# Patient Record
Sex: Female | Born: 2000 | Race: White | Hispanic: No | Marital: Single | Smoking: Never smoker
Health system: Southern US, Community
[De-identification: ages and names within clinical notes are randomized; demographics above are authoritative.]

## PROBLEM LIST (undated history)

## (undated) DIAGNOSIS — R109 Unspecified abdominal pain: Secondary | ICD-10-CM

## (undated) DIAGNOSIS — K219 Gastro-esophageal reflux disease without esophagitis: Secondary | ICD-10-CM

## (undated) DIAGNOSIS — IMO0001 Reserved for inherently not codable concepts without codable children: Secondary | ICD-10-CM

## (undated) DIAGNOSIS — F419 Anxiety disorder, unspecified: Secondary | ICD-10-CM

## (undated) DIAGNOSIS — J45909 Unspecified asthma, uncomplicated: Secondary | ICD-10-CM

## (undated) DIAGNOSIS — K5900 Constipation, unspecified: Secondary | ICD-10-CM

## (undated) HISTORY — DX: Anxiety disorder, unspecified: F41.9

## (undated) HISTORY — PX: WISDOM TOOTH EXTRACTION: SHX21

## (undated) HISTORY — DX: Unspecified abdominal pain: R10.9

---
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
## 2018-11-03 DIAGNOSIS — J45909 Unspecified asthma, uncomplicated: Secondary | ICD-10-CM | POA: Insufficient documentation

## 2018-12-28 IMAGING — US US ABDOMEN COMPLETE
1 series · 14 of 25 positions shown · non-contrast
Comparison: July 04, 2011

CLINICAL DATA: Nausea and abdominal pain, chronic

EXAM:
ABDOMEN ULTRASOUND COMPLETE

[Series 1: us abdomen complete · 0.19mm/px · 14 of 73 slices shown]
[im 1/73]
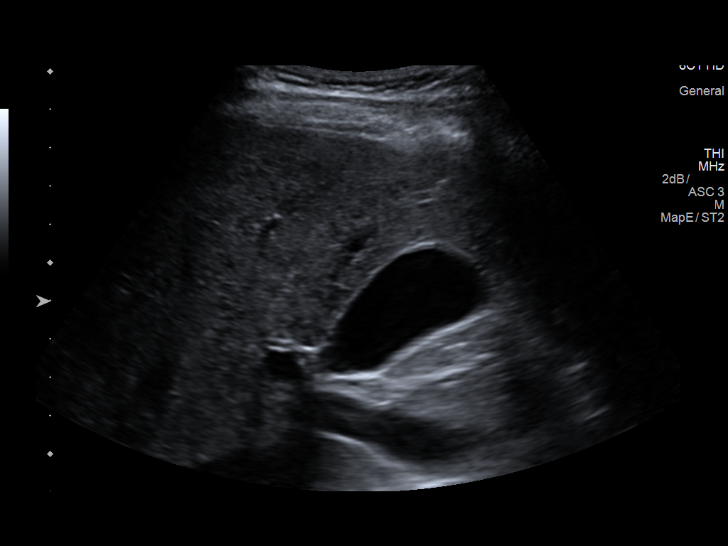
[im 7/73]
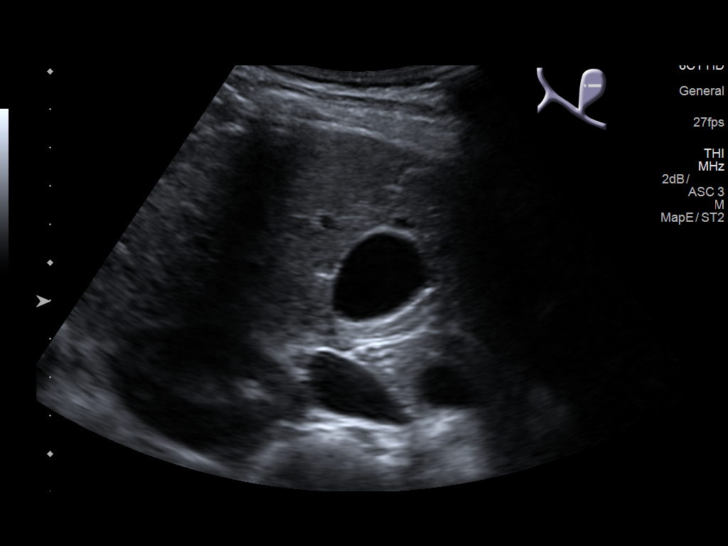
[im 13/73]
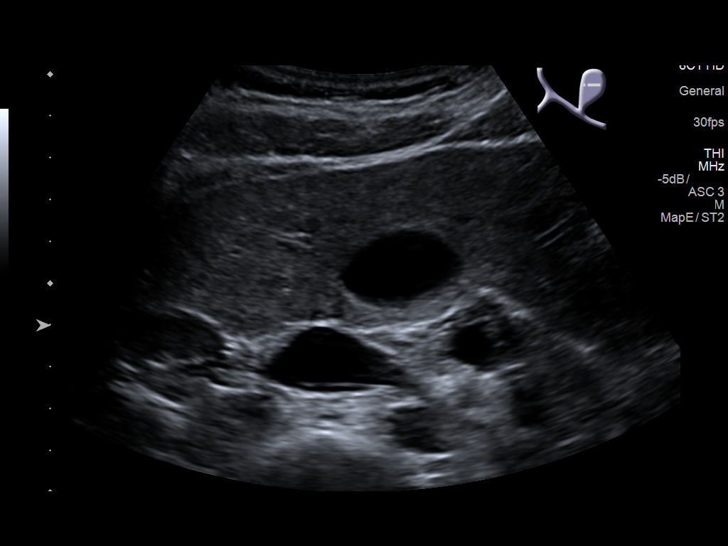
[im 19/73]
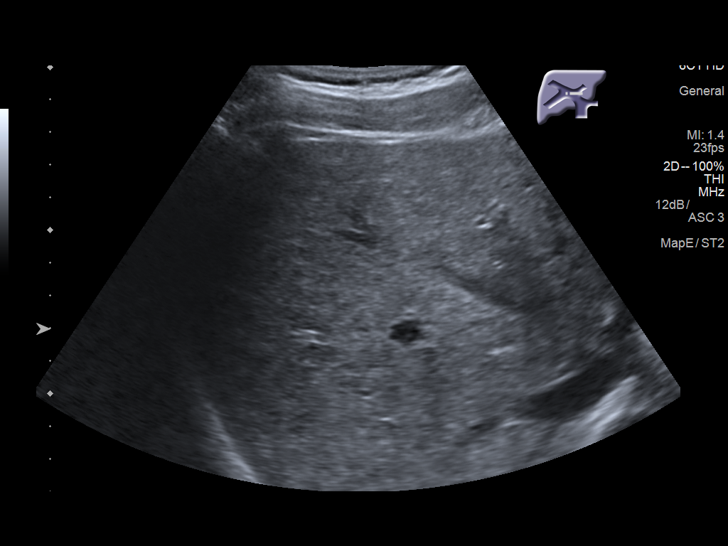
[im 25/73]
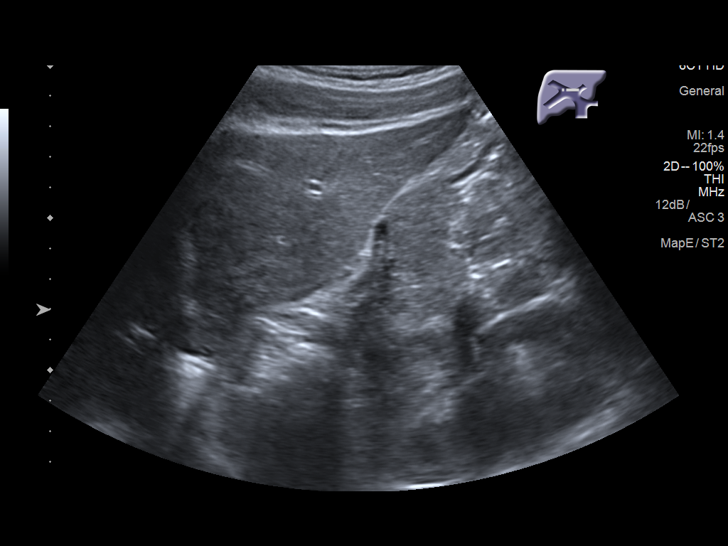
[im 28/73]
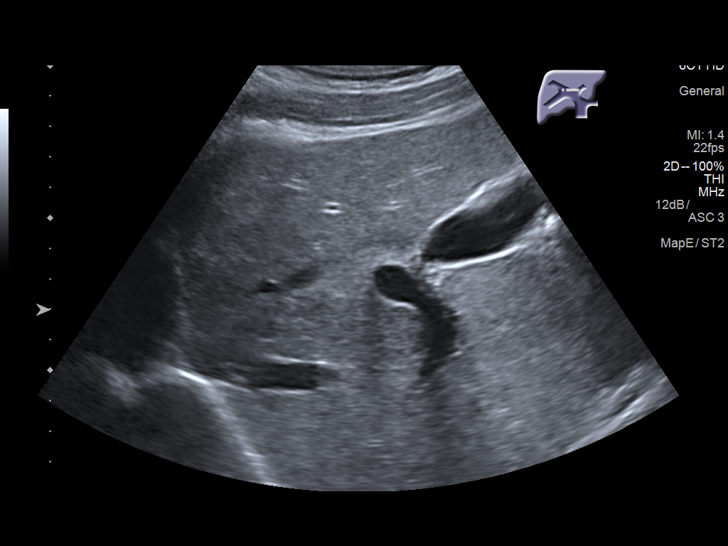
[im 34/73]
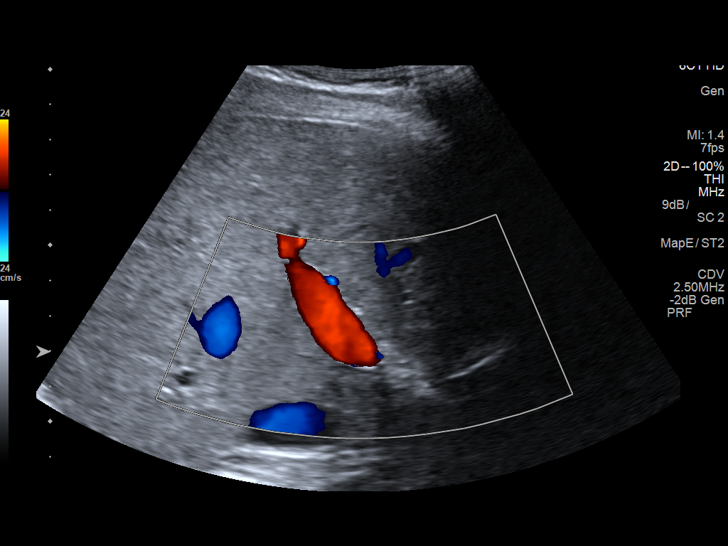
[im 40/73]
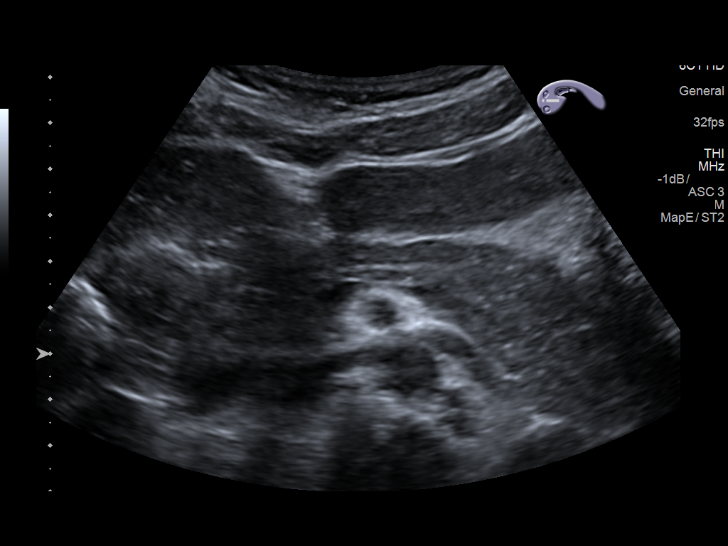
[im 46/73]
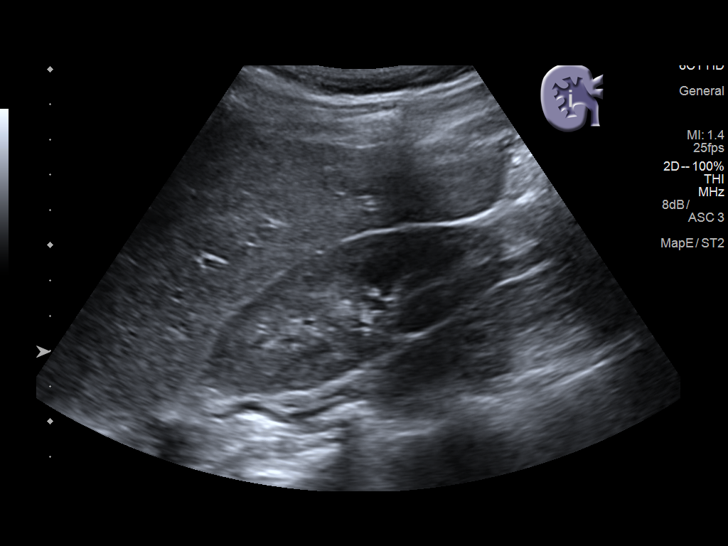
[im 49/73]
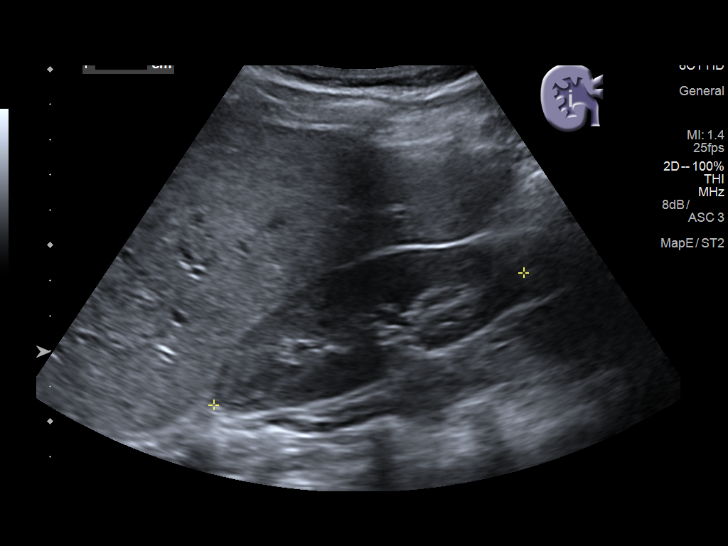
[im 55/73]
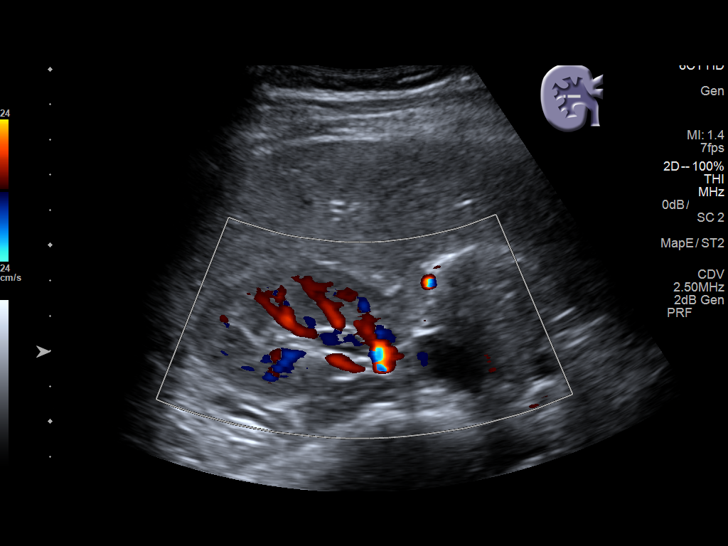
[im 61/73]
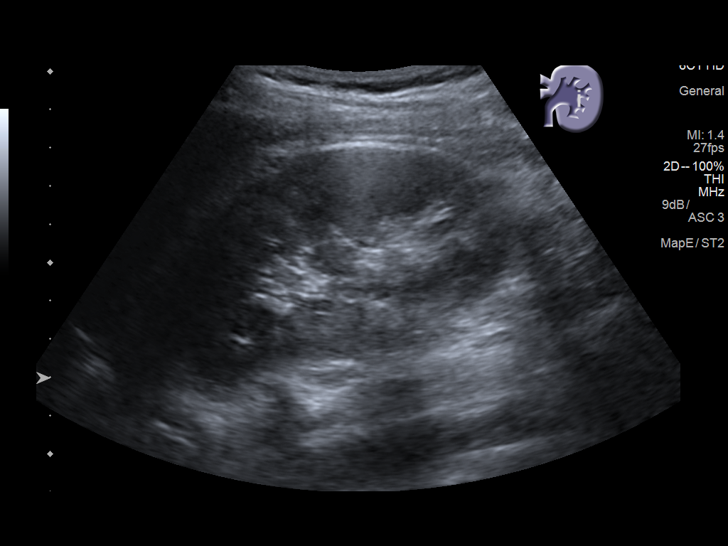
[im 67/73]
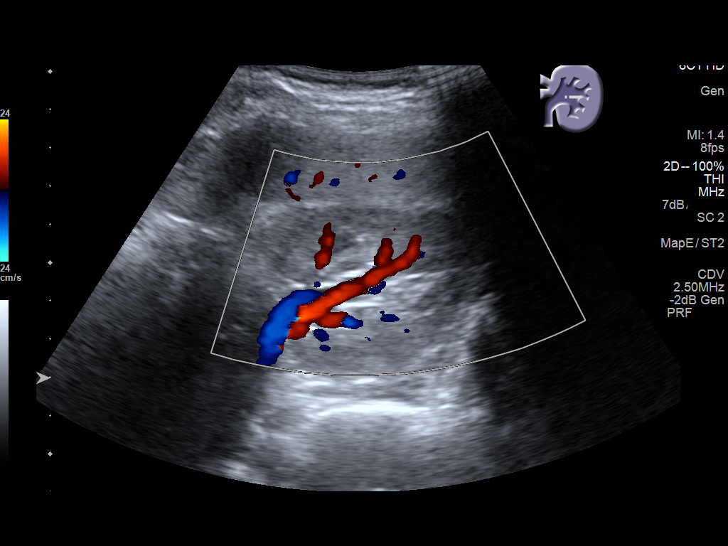
[im 73/73]
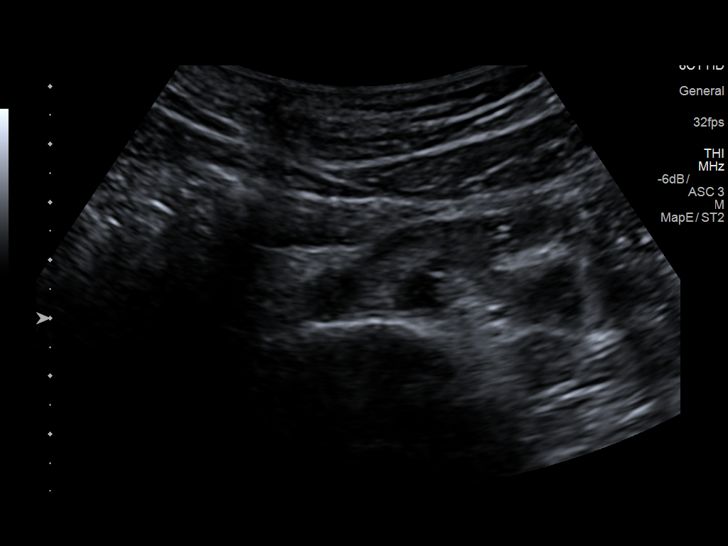

[14 of 25 positions shown; findings below may reference images not displayed]

FINDINGS: Gallbladder: No gallstones or wall thickening visualized. There is
no pericholecystic fluid. No sonographic Murphy sign noted by
sonographer.

Common bile duct: Diameter: 2 mm. No intrahepatic, common hepatic,
or common bile duct dilatation.

Liver: No focal lesion identified. Within normal limits in
parenchymal echogenicity.

IVC: No abnormality visualized.

Pancreas: No mass or inflammatory focus.

Spleen: Size and appearance within normal limits.

Right Kidney: Length: 9.9 cm. Echogenicity within normal limits. No
mass or hydronephrosis visualized.

Left Kidney: Length: 10.0 cm. Echogenicity within normal limits. No
mass or hydronephrosis visualized.

Abdominal aorta: No aneurysm visualized.

Other findings: No demonstrable ascites.
IMPRESSION: Study within normal limits.

## 2019-01-04 ENCOUNTER — Ambulatory Visit: Payer: PRIVATE HEALTH INSURANCE | Attending: Internal Medicine

## 2019-01-04 DIAGNOSIS — U071 COVID-19: Secondary | ICD-10-CM

## 2019-01-06 LAB — NOVEL CORONAVIRUS, NAA: SARS-CoV-2, NAA: NOT DETECTED

## 2019-01-18 ENCOUNTER — Telehealth (INDEPENDENT_AMBULATORY_CARE_PROVIDER_SITE_OTHER): Payer: BC Managed Care – PPO | Admitting: Nurse Practitioner

## 2019-01-18 ENCOUNTER — Other Ambulatory Visit: Payer: Self-pay

## 2019-01-18 ENCOUNTER — Other Ambulatory Visit: Payer: PRIVATE HEALTH INSURANCE

## 2019-01-18 ENCOUNTER — Encounter: Payer: Self-pay | Admitting: Nurse Practitioner

## 2019-01-18 VITALS — Ht 63.0 in | Wt 130.0 lb

## 2019-01-18 DIAGNOSIS — J4520 Mild intermittent asthma, uncomplicated: Secondary | ICD-10-CM | POA: Diagnosis not present

## 2019-01-18 DIAGNOSIS — R5383 Other fatigue: Secondary | ICD-10-CM | POA: Insufficient documentation

## 2019-01-18 DIAGNOSIS — F411 Generalized anxiety disorder: Secondary | ICD-10-CM | POA: Insufficient documentation

## 2019-01-18 DIAGNOSIS — G43009 Migraine without aura, not intractable, without status migrainosus: Secondary | ICD-10-CM | POA: Insufficient documentation

## 2019-01-18 DIAGNOSIS — R5382 Chronic fatigue, unspecified: Secondary | ICD-10-CM

## 2019-01-18 LAB — CBC WITH DIFFERENTIAL/PLATELET
Basophils Absolute: 0 10*3/uL (ref 0.0–0.1)
Basophils Relative: 0.7 % (ref 0.0–3.0)
Eosinophils Absolute: 0.1 10*3/uL (ref 0.0–0.7)
Eosinophils Relative: 1.7 % (ref 0.0–5.0)
HCT: 39.9 % (ref 36.0–46.0)
Hemoglobin: 13.5 g/dL (ref 12.0–15.0)
Lymphocytes Relative: 28.9 % (ref 12.0–46.0)
Lymphs Abs: 2 10*3/uL (ref 0.7–4.0)
MCHC: 34 g/dL (ref 30.0–36.0)
MCV: 86.1 fl (ref 78.0–100.0)
Monocytes Absolute: 0.3 10*3/uL (ref 0.1–1.0)
Monocytes Relative: 4.4 % (ref 3.0–12.0)
Neutro Abs: 4.4 10*3/uL (ref 1.4–7.7)
Neutrophils Relative %: 64.3 % (ref 43.0–77.0)
Platelets: 294 10*3/uL (ref 150.0–400.0)
RBC: 4.63 Mil/uL (ref 3.87–5.11)
RDW: 12 % (ref 11.5–14.6)
WBC: 6.9 10*3/uL (ref 4.5–10.5)

## 2019-01-18 LAB — COMPREHENSIVE METABOLIC PANEL
ALT: 11 U/L (ref 0–35)
AST: 17 U/L (ref 0–37)
Albumin: 4.4 g/dL (ref 3.5–5.2)
Alkaline Phosphatase: 65 U/L (ref 39–117)
BUN: 10 mg/dL (ref 6–23)
CO2: 24 mEq/L (ref 19–32)
Calcium: 9.7 mg/dL (ref 8.4–10.5)
Chloride: 101 mEq/L (ref 96–112)
Creatinine, Ser: 0.79 mg/dL (ref 0.40–1.20)
GFR: 91.93 mL/min (ref >60.00)
Glucose, Bld: 128 mg/dL — ABNORMAL HIGH (ref 70–99)
Potassium: 3.7 mEq/L (ref 3.5–5.1)
Sodium: 136 mEq/L (ref 135–145)
Total Bilirubin: 0.6 mg/dL (ref 0.2–1.2)
Total Protein: 7 g/dL (ref 6.0–8.3)

## 2019-01-18 LAB — IBC + FERRITIN
Ferritin: 21.9 ng/mL (ref 10.0–291.0)
Iron: 151 ug/dL — ABNORMAL HIGH (ref 42–145)
Saturation Ratios: 33.7 % (ref 20.0–50.0)
Transferrin: 320 mg/dL (ref 212.0–360.0)

## 2019-01-18 LAB — TSH: TSH: 1.86 u[IU]/mL (ref 0.35–5.50)

## 2019-01-18 MED ORDER — BUTALBITAL-APAP-CAFFEINE 50-325-40 MG PO TABS: 1.0000 | ORAL_TABLET | Freq: Four times a day (QID) | ORAL | 0 refills | Status: AC | PRN

## 2019-01-18 MED ORDER — ONDANSETRON HCL 4 MG PO TABS: 4.0000 mg | ORAL_TABLET | Freq: Three times a day (TID) | ORAL | 0 refills | Status: DC | PRN

## 2019-01-18 NOTE — Assessment & Plan Note (Signed)
Diagnosed 66yrs ago, current use of cymbalta daily with significant improvement. Completed sessions with therapist. Reports stable mood and will like to wean off medication.  Stabilize headache, then wean off medication later Re eval in 2weeks

## 2019-01-18 NOTE — Assessment & Plan Note (Addendum)
Onset 57months ago, waxing and waning, describes as global tightness, persistent and worsening, ranges from mild to severe, associated with nausea.  minimal improvement with tylenol or ibuprofen. Up to date with eye exam (within last 1year), use of corrective lens. No improvement with outpatient PT x 2weeks. No impact with caffeine intake (1cup 3x/week) No hx of head trauma or brain tumor. No ETOH or nicotine or marijuana or illicit drug use. FHx of migraine (mother). Use of OCP-Sprintec x 1year  Fioricet Rx sent. Check cbc, BMP and TSH. F/up in 2wekks or sooner if needed

## 2019-01-18 NOTE — Progress Notes (Signed)
Virtual Visit via Video Note  I connected with Cynthia Washington on 01/18/19 at  9:00 AM EST by a video enabled telemedicine application and verified that I am speaking with the correct person using two identifiers.  Location: Patient:home Provider:office Participants: patient and provider I discussed the limitations of evaluation and management by telemedicine and the availability of in person appointments. The patient expressed understanding and agreed to proceed.  CC: Establish care, discuss headache, fatigue and anxiety.  History of Present Illness: Migraine  This is a recurrent problem. The current episode started more than 1 month ago. The problem occurs daily. The problem has been gradually worsening. The pain is located in the temporal, occipital, parietal and bilateral region. The pain does not radiate. The pain quality is not similar to prior headaches. The quality of the pain is described as band-like. The pain is moderate. Associated symptoms include muscle aches and nausea. Pertinent negatives include no abdominal pain, back pain, blurred vision, coughing, dizziness, eye pain, eye redness, eye watering, facial sweating, fever, hearing loss, insomnia, loss of balance, neck pain, phonophobia, photophobia, rhinorrhea, scalp tenderness, seizures, sinus pressure, sore throat, swollen glands, tingling, tinnitus, visual change, vomiting, weakness or weight loss. The symptoms are aggravated by unknown. She has tried acetaminophen and NSAIDs (caffeine) for the symptoms. The treatment provided mild relief. Her past medical history is significant for migraines in the family. There is no history of cluster headaches, hypertension, migraine headaches, obesity, pseudotumor cerebri, recent head traumas, sinus disease or TMJ.   Review of Systems  Constitutional: Negative for chills, diaphoresis, fever, malaise/fatigue and weight loss.  HENT: Negative.  Negative for hearing loss, rhinorrhea, sinus  pressure, sore throat and tinnitus.   Eyes: Negative.  Negative for blurred vision, photophobia, pain and redness.  Respiratory: Negative.  Negative for cough.   Cardiovascular: Negative.   Gastrointestinal: Positive for nausea. Negative for abdominal pain, blood in stool, constipation, diarrhea, heartburn, melena and vomiting.  Genitourinary: Negative.   Musculoskeletal: Positive for myalgias. Negative for back pain, falls, joint pain and neck pain.  Skin: Negative.   Neurological: Positive for headaches. Negative for dizziness, tingling, sensory change, speech change, focal weakness, seizures, loss of consciousness, weakness and loss of balance.  Psychiatric/Behavioral: Negative.  The patient does not have insomnia.     Reviewed medication list, medical, surgical, social, and family history with patient.  Observations/Objective: Physical Exam  Constitutional: She is oriented to person, place, and time. No distress.  Respiratory: Effort normal.  Musculoskeletal:     Cervical back: Normal range of motion and neck supple.  Neurological: She is alert and oriented to person, place, and time. No cranial nerve deficit.  Psychiatric: She has a normal mood and affect. Her behavior is normal. Thought content normal.   Assessment and Plan: Cynthia Washington was seen today for establish care.  Diagnoses and all orders for this visit:  Migraine without aura and without status migrainosus, not intractable -     butalbital-acetaminophen-caffeine (FIORICET) 50-325-40 MG tablet; Take 1 tablet by mouth every 6 (six) hours as needed for headache or migraine. -     ondansetron (ZOFRAN) 4 MG tablet; Take 1 tablet (4 mg total) by mouth every 8 (eight) hours as needed for nausea or vomiting.  Chronic fatigue -     CBC w/Diff -     CMP -     TSH -     Vitamin D 1,25 dihydroxy -     IBC + Ferritin -  HIV antibody (with reflex)  Mild intermittent asthma without complication  GAD (generalized anxiety  disorder)   Follow Up Instructions: See avs   I discussed the assessment and treatment plan with the patient. The patient was provided an opportunity to ask questions and all were answered. The patient agreed with the plan and demonstrated an understanding of the instructions.   The patient was advised to call back or seek an in-person evaluation if the symptoms worsen or if the condition fails to improve as anticipated.  Alysia Penna, NP

## 2019-01-18 NOTE — Assessment & Plan Note (Addendum)
Onset 14months ago, worsening Associated with low energy, sleeping >12hrs and still feels tired, Muscleaches, Noted 10lbs weight gain in last 36months. Denies any change in appetite, no joint pain or stiffness or swelling, no palpitation, no swollen lymph node, no upper respiratory or GU symptoms Hx of GAD with use of cymbalta in last 70yrs. Mood has been stable. Use of OCP-sprintec x 1year, no hx of menorrhagia.  Check cbc, iron panel, vitamin D, TSH, and BMP.

## 2019-01-18 NOTE — Assessment & Plan Note (Signed)
Diagnosed at age 19 per patient. Previous use of advair, but no longer needs it. Current use of albuterol prn, has not used it in several months.

## 2019-01-18 NOTE — Patient Instructions (Addendum)
Go to lab for blood draw.  Start fioricet. Call office or send mychart message if no improvement in 3days. Maintain adequate oral hydration. Hold on cymbalta discontinuation at this time.  Migraine Headache A migraine headache is a very strong throbbing pain on one side or both sides of your head. This type of headache can also cause other symptoms. It can last from 4 hours to 3 days. Talk with your doctor about what things may bring on (trigger) this condition. What are the causes? The exact cause of this condition is not known. This condition may be triggered or caused by:  Drinking alcohol.  Smoking.  Taking medicines, such as: ? Medicine used to treat chest pain (nitroglycerin). ? Birth control pills. ? Estrogen. ? Some blood pressure medicines.  Eating or drinking certain products.  Doing physical activity. Other things that may trigger a migraine headache include:  Having a menstrual period.  Pregnancy.  Hunger.  Stress.  Not getting enough sleep or getting too much sleep.  Weather changes.  Tiredness (fatigue). What increases the risk?  Being 27-30 years old.  Being female.  Having a family history of migraine headaches.  Being Caucasian.  Having depression or anxiety.  Being very overweight. What are the signs or symptoms?  A throbbing pain. This pain may: ? Happen in any area of the head, such as on one side or both sides. ? Make it hard to do daily activities. ? Get worse with physical activity. ? Get worse around bright lights or loud noises.  Other symptoms may include: ? Feeling sick to your stomach (nauseous). ? Vomiting. ? Dizziness. ? Being sensitive to bright lights, loud noises, or smells.  Before you get a migraine headache, you may get warning signs (an aura). An aura may include: ? Seeing flashing lights or having blind spots. ? Seeing bright spots, halos, or zigzag lines. ? Having tunnel vision or blurred vision. ? Having  numbness or a tingling feeling. ? Having trouble talking. ? Having weak muscles.  Some people have symptoms after a migraine headache (postdromal phase), such as: ? Tiredness. ? Trouble thinking (concentrating). How is this treated?  Taking medicines that: ? Relieve pain. ? Relieve the feeling of being sick to your stomach. ? Prevent migraine headaches.  Treatment may also include: ? Having acupuncture. ? Avoiding foods that bring on migraine headaches. ? Learning ways to control your body functions (biofeedback). ? Therapy to help you know and deal with negative thoughts (cognitive behavioral therapy). Follow these instructions at home: Medicines  Take over-the-counter and prescription medicines only as told by your doctor.  Ask your doctor if the medicine prescribed to you: ? Requires you to avoid driving or using heavy machinery. ? Can cause trouble pooping (constipation). You may need to take these steps to prevent or treat trouble pooping:  Drink enough fluid to keep your pee (urine) pale yellow.  Take over-the-counter or prescription medicines.  Eat foods that are high in fiber. These include beans, whole grains, and fresh fruits and vegetables.  Limit foods that are high in fat and sugar. These include fried or sweet foods. Lifestyle  Do not drink alcohol.  Do not use any products that contain nicotine or tobacco, such as cigarettes, e-cigarettes, and chewing tobacco. If you need help quitting, ask your doctor.  Get at least 8 hours of sleep every night.  Limit and deal with stress. General instructions      Keep a journal to find out what may  bring on your migraine headaches. For example, write down: ? What you eat and drink. ? How much sleep you get. ? Any change in what you eat or drink. ? Any change in your medicines.  If you have a migraine headache: ? Avoid things that make your symptoms worse, such as bright lights. ? It may help to lie down in a  dark, quiet room. ? Do not drive or use heavy machinery. ? Ask your doctor what activities are safe for you.  Keep all follow-up visits as told by your doctor. This is important. Contact a doctor if:  You get a migraine headache that is different or worse than others you have had.  You have more than 15 headache days in one month. Get help right away if:  Your migraine headache gets very bad.  Your migraine headache lasts longer than 72 hours.  You have a fever.  You have a stiff neck.  You have trouble seeing.  Your muscles feel weak or like you cannot control them.  You start to lose your balance a lot.  You start to have trouble walking.  You pass out (faint).  You have a seizure. Summary  A migraine headache is a very strong throbbing pain on one side or both sides of your head. These headaches can also cause other symptoms.  This condition may be treated with medicines and changes to your lifestyle.  Keep a journal to find out what may bring on your migraine headaches.  Contact a doctor if you get a migraine headache that is different or worse than others you have had.  Contact your doctor if you have more than 15 headache days in a month. This information is not intended to replace advice given to you by your health care provider. Make sure you discuss any questions you have with your health care provider. Document Revised: 04/23/2018 Document Reviewed: 02/11/2018 Elsevier Patient Education  [place].

## 2019-01-19 ENCOUNTER — Encounter: Payer: Self-pay | Admitting: Nurse Practitioner

## 2019-01-21 LAB — HIV ANTIBODY (ROUTINE TESTING W REFLEX): HIV 1&2 Ab, 4th Generation: NONREACTIVE

## 2019-01-21 LAB — VITAMIN D 1,25 DIHYDROXY
Vitamin D 1, 25 (OH)2 Total: 56 pg/mL (ref 18–72)
Vitamin D2 1, 25 (OH)2: <8 pg/mL
Vitamin D3 1, 25 (OH)2: 56 pg/mL

## 2019-01-27 ENCOUNTER — Other Ambulatory Visit: Payer: BC Managed Care – PPO

## 2019-01-31 ENCOUNTER — Ambulatory Visit: Payer: PRIVATE HEALTH INSURANCE | Admitting: Nurse Practitioner

## 2019-04-28 DIAGNOSIS — Z23 Encounter for immunization: Secondary | ICD-10-CM | POA: Diagnosis not present

## 2019-05-22 DIAGNOSIS — J020 Streptococcal pharyngitis: Secondary | ICD-10-CM | POA: Diagnosis not present

## 2019-05-22 DIAGNOSIS — J0301 Acute recurrent streptococcal tonsillitis: Secondary | ICD-10-CM | POA: Diagnosis not present

## 2019-08-01 DIAGNOSIS — N390 Urinary tract infection, site not specified: Secondary | ICD-10-CM | POA: Diagnosis not present

## 2019-09-09 DIAGNOSIS — Z6822 Body mass index (BMI) 22.0-22.9, adult: Secondary | ICD-10-CM | POA: Diagnosis not present

## 2019-09-09 DIAGNOSIS — B9689 Other specified bacterial agents as the cause of diseases classified elsewhere: Secondary | ICD-10-CM | POA: Diagnosis not present

## 2019-09-09 DIAGNOSIS — N760 Acute vaginitis: Secondary | ICD-10-CM | POA: Diagnosis not present

## 2019-09-09 DIAGNOSIS — N898 Other specified noninflammatory disorders of vagina: Secondary | ICD-10-CM | POA: Diagnosis not present

## 2019-10-21 DIAGNOSIS — R21 Rash and other nonspecific skin eruption: Secondary | ICD-10-CM | POA: Diagnosis not present

## 2019-10-21 DIAGNOSIS — Z6822 Body mass index (BMI) 22.0-22.9, adult: Secondary | ICD-10-CM | POA: Diagnosis not present

## 2019-11-02 DIAGNOSIS — Z6824 Body mass index (BMI) 24.0-24.9, adult: Secondary | ICD-10-CM | POA: Diagnosis not present

## 2019-11-02 DIAGNOSIS — N898 Other specified noninflammatory disorders of vagina: Secondary | ICD-10-CM | POA: Diagnosis not present

## 2019-11-02 DIAGNOSIS — N760 Acute vaginitis: Secondary | ICD-10-CM | POA: Diagnosis not present

## 2019-11-02 DIAGNOSIS — B9689 Other specified bacterial agents as the cause of diseases classified elsewhere: Secondary | ICD-10-CM | POA: Diagnosis not present

## 2019-11-17 DIAGNOSIS — L301 Dyshidrosis [pompholyx]: Secondary | ICD-10-CM | POA: Diagnosis not present

## 2019-11-17 DIAGNOSIS — L858 Other specified epidermal thickening: Secondary | ICD-10-CM | POA: Diagnosis not present

## 2020-01-05 DIAGNOSIS — F32A Depression, unspecified: Secondary | ICD-10-CM | POA: Diagnosis not present

## 2020-01-05 DIAGNOSIS — F419 Anxiety disorder, unspecified: Secondary | ICD-10-CM | POA: Diagnosis not present

## 2020-01-05 DIAGNOSIS — G43009 Migraine without aura, not intractable, without status migrainosus: Secondary | ICD-10-CM | POA: Diagnosis not present

## 2020-01-09 DIAGNOSIS — Z113 Encounter for screening for infections with a predominantly sexual mode of transmission: Secondary | ICD-10-CM | POA: Diagnosis not present

## 2020-01-09 DIAGNOSIS — N939 Abnormal uterine and vaginal bleeding, unspecified: Secondary | ICD-10-CM | POA: Diagnosis not present

## 2020-01-09 DIAGNOSIS — N760 Acute vaginitis: Secondary | ICD-10-CM | POA: Diagnosis not present

## 2020-02-22 DIAGNOSIS — Z113 Encounter for screening for infections with a predominantly sexual mode of transmission: Secondary | ICD-10-CM | POA: Diagnosis not present

## 2020-02-22 DIAGNOSIS — Z01419 Encounter for gynecological examination (general) (routine) without abnormal findings: Secondary | ICD-10-CM | POA: Diagnosis not present

## 2020-02-22 DIAGNOSIS — N939 Abnormal uterine and vaginal bleeding, unspecified: Secondary | ICD-10-CM | POA: Diagnosis not present

## 2020-02-22 DIAGNOSIS — Z6827 Body mass index (BMI) 27.0-27.9, adult: Secondary | ICD-10-CM | POA: Diagnosis not present

## 2020-02-22 DIAGNOSIS — N760 Acute vaginitis: Secondary | ICD-10-CM | POA: Diagnosis not present

## 2020-04-04 DIAGNOSIS — R895 Abnormal microbiological findings in specimens from other organs, systems and tissues: Secondary | ICD-10-CM | POA: Diagnosis not present

## 2020-05-22 DIAGNOSIS — R8290 Unspecified abnormal findings in urine: Secondary | ICD-10-CM | POA: Diagnosis not present

## 2020-05-22 DIAGNOSIS — R895 Abnormal microbiological findings in specimens from other organs, systems and tissues: Secondary | ICD-10-CM | POA: Diagnosis not present

## 2021-02-07 DIAGNOSIS — R5383 Other fatigue: Secondary | ICD-10-CM | POA: Diagnosis not present

## 2021-02-07 DIAGNOSIS — F411 Generalized anxiety disorder: Secondary | ICD-10-CM | POA: Diagnosis not present

## 2021-04-17 ENCOUNTER — Institutional Professional Consult (permissible substitution): Payer: BC Managed Care – PPO | Admitting: Neurology

## 2021-04-17 ENCOUNTER — Telehealth: Payer: Self-pay | Admitting: Neurology

## 2021-04-17 NOTE — Telephone Encounter (Signed)
Pt cancelled appt due sick with the flu ?

## 2021-04-18 DIAGNOSIS — Z20828 Contact with and (suspected) exposure to other viral communicable diseases: Secondary | ICD-10-CM | POA: Diagnosis not present

## 2021-04-18 DIAGNOSIS — K529 Noninfective gastroenteritis and colitis, unspecified: Secondary | ICD-10-CM | POA: Diagnosis not present

## 2021-04-18 DIAGNOSIS — J029 Acute pharyngitis, unspecified: Secondary | ICD-10-CM | POA: Diagnosis not present

## 2021-04-18 DIAGNOSIS — Z03818 Encounter for observation for suspected exposure to other biological agents ruled out: Secondary | ICD-10-CM | POA: Diagnosis not present

## 2021-04-18 DIAGNOSIS — Z20822 Contact with and (suspected) exposure to covid-19: Secondary | ICD-10-CM | POA: Diagnosis not present

## 2021-05-06 DIAGNOSIS — L03032 Cellulitis of left toe: Secondary | ICD-10-CM | POA: Diagnosis not present

## 2021-05-06 DIAGNOSIS — L600 Ingrowing nail: Secondary | ICD-10-CM | POA: Diagnosis not present

## 2021-11-28 DIAGNOSIS — Z111 Encounter for screening for respiratory tuberculosis: Secondary | ICD-10-CM | POA: Diagnosis not present

## 2021-11-30 DIAGNOSIS — Z23 Encounter for immunization: Secondary | ICD-10-CM | POA: Diagnosis not present

## 2021-12-18 DIAGNOSIS — Z01419 Encounter for gynecological examination (general) (routine) without abnormal findings: Secondary | ICD-10-CM | POA: Diagnosis not present

## 2021-12-18 DIAGNOSIS — Z6826 Body mass index (BMI) 26.0-26.9, adult: Secondary | ICD-10-CM | POA: Diagnosis not present

## 2021-12-18 DIAGNOSIS — N760 Acute vaginitis: Secondary | ICD-10-CM | POA: Diagnosis not present

## 2022-01-21 ENCOUNTER — Other Ambulatory Visit: Payer: Self-pay | Admitting: Obstetrics and Gynecology

## 2022-01-21 DIAGNOSIS — N6452 Nipple discharge: Secondary | ICD-10-CM

## 2022-01-23 ENCOUNTER — Other Ambulatory Visit: Payer: Self-pay | Admitting: Obstetrics and Gynecology

## 2022-01-23 DIAGNOSIS — N6452 Nipple discharge: Secondary | ICD-10-CM

## 2022-01-26 ENCOUNTER — Ambulatory Visit
Admission: RE | Admit: 2022-01-26 | Discharge: 2022-01-26 | Disposition: A | Discharge status: Home / Self Care | Payer: BC Managed Care – PPO | Source: Ambulatory Visit | Attending: Obstetrics and Gynecology | Admitting: Obstetrics and Gynecology

## 2022-01-26 DIAGNOSIS — N6452 Nipple discharge: Secondary | ICD-10-CM | POA: Diagnosis not present

## 2022-01-26 MED ORDER — GADOPICLENOL 0.5 MMOL/ML IV SOLN
7.0000 mL | Freq: Once | INTRAVENOUS | Status: AC | PRN
  Administered 2022-01-26: 7 mL via INTRAVENOUS

## 2022-01-27 ENCOUNTER — Ambulatory Visit
Admission: RE | Admit: 2022-01-27 | Discharge: 2022-01-27 | Disposition: A | Discharge status: Home / Self Care | Payer: BC Managed Care – PPO | Source: Ambulatory Visit | Attending: Obstetrics and Gynecology | Admitting: Obstetrics and Gynecology

## 2022-01-27 ENCOUNTER — Other Ambulatory Visit: Payer: Self-pay | Admitting: Obstetrics and Gynecology

## 2022-01-27 DIAGNOSIS — N6452 Nipple discharge: Secondary | ICD-10-CM

## 2022-01-27 DIAGNOSIS — N6310 Unspecified lump in the right breast, unspecified quadrant: Secondary | ICD-10-CM

## 2022-01-27 DIAGNOSIS — N6315 Unspecified lump in the right breast, overlapping quadrants: Secondary | ICD-10-CM | POA: Diagnosis not present

## 2022-01-27 DIAGNOSIS — R922 Inconclusive mammogram: Secondary | ICD-10-CM | POA: Diagnosis not present

## 2022-01-27 DIAGNOSIS — N6011 Diffuse cystic mastopathy of right breast: Secondary | ICD-10-CM | POA: Diagnosis not present

## 2022-01-27 HISTORY — PX: BREAST BIOPSY: SHX20

## 2022-01-28 ENCOUNTER — Other Ambulatory Visit: Payer: Self-pay | Admitting: Obstetrics and Gynecology

## 2022-01-28 DIAGNOSIS — R928 Other abnormal and inconclusive findings on diagnostic imaging of breast: Secondary | ICD-10-CM

## 2022-02-26 ENCOUNTER — Other Ambulatory Visit: Payer: Self-pay | Admitting: General Surgery

## 2022-02-26 DIAGNOSIS — N6310 Unspecified lump in the right breast, unspecified quadrant: Secondary | ICD-10-CM

## 2022-03-06 ENCOUNTER — Other Ambulatory Visit: Payer: Self-pay | Admitting: General Surgery

## 2022-03-06 ENCOUNTER — Ambulatory Visit
Admission: RE | Admit: 2022-03-06 | Discharge: 2022-03-06 | Disposition: A | Discharge status: Home / Self Care | Payer: BC Managed Care – PPO | Source: Ambulatory Visit | Attending: Obstetrics and Gynecology | Admitting: Obstetrics and Gynecology

## 2022-03-06 DIAGNOSIS — N6452 Nipple discharge: Secondary | ICD-10-CM | POA: Diagnosis not present

## 2022-03-06 DIAGNOSIS — R928 Other abnormal and inconclusive findings on diagnostic imaging of breast: Secondary | ICD-10-CM | POA: Diagnosis not present

## 2022-03-06 DIAGNOSIS — D241 Benign neoplasm of right breast: Secondary | ICD-10-CM | POA: Diagnosis not present

## 2022-03-06 MED ORDER — ONDANSETRON 4 MG PO TBDP
4.0000 mg | ORAL_TABLET | Freq: Once | ORAL | Status: AC
  Administered 2022-03-06: 4 mg via ORAL

## 2022-03-06 MED ORDER — GADOPICLENOL 0.5 MMOL/ML IV SOLN
6.0000 mL | Freq: Once | INTRAVENOUS | Status: AC | PRN
  Administered 2022-03-06: 6 mL via INTRAVENOUS

## 2022-04-04 ENCOUNTER — Other Ambulatory Visit: Payer: Self-pay

## 2022-04-04 ENCOUNTER — Encounter (HOSPITAL_BASED_OUTPATIENT_CLINIC_OR_DEPARTMENT_OTHER): Payer: Self-pay | Admitting: General Surgery

## 2022-04-14 ENCOUNTER — Encounter (HOSPITAL_BASED_OUTPATIENT_CLINIC_OR_DEPARTMENT_OTHER)
Admission: RE | Admit: 2022-04-14 | Discharge: 2022-04-14 | Disposition: A | Discharge status: Home / Self Care | Payer: BC Managed Care – PPO | Source: Ambulatory Visit | Attending: General Surgery | Admitting: General Surgery

## 2022-04-14 ENCOUNTER — Ambulatory Visit
Admission: RE | Admit: 2022-04-14 | Discharge: 2022-04-14 | Disposition: A | Discharge status: Home / Self Care | Payer: BC Managed Care – PPO | Source: Ambulatory Visit | Attending: General Surgery | Admitting: General Surgery

## 2022-04-14 DIAGNOSIS — N6452 Nipple discharge: Secondary | ICD-10-CM | POA: Diagnosis not present

## 2022-04-14 DIAGNOSIS — N6041 Mammary duct ectasia of right breast: Secondary | ICD-10-CM | POA: Diagnosis not present

## 2022-04-14 DIAGNOSIS — Z01818 Encounter for other preprocedural examination: Secondary | ICD-10-CM | POA: Insufficient documentation

## 2022-04-14 DIAGNOSIS — N6310 Unspecified lump in the right breast, unspecified quadrant: Secondary | ICD-10-CM

## 2022-04-14 DIAGNOSIS — R928 Other abnormal and inconclusive findings on diagnostic imaging of breast: Secondary | ICD-10-CM | POA: Diagnosis not present

## 2022-04-14 DIAGNOSIS — D241 Benign neoplasm of right breast: Secondary | ICD-10-CM | POA: Diagnosis not present

## 2022-04-14 HISTORY — PX: BREAST BIOPSY: SHX20

## 2022-04-14 LAB — BASIC METABOLIC PANEL
Anion gap: 10 (ref 5–15)
BUN: 14 mg/dL (ref 6–20)
CO2: 25 mmol/L (ref 22–32)
Calcium: 9.7 mg/dL (ref 8.9–10.3)
Chloride: 102 mmol/L (ref 98–111)
Creatinine, Ser: 0.87 mg/dL (ref 0.44–1.00)
GFR, Estimated: >60 mL/min (ref >60)
Glucose, Bld: 91 mg/dL (ref 70–99)
Potassium: 4.7 mmol/L (ref 3.5–5.1)
Sodium: 137 mmol/L (ref 135–145)

## 2022-04-14 MED ORDER — ENSURE PRE-SURGERY PO LIQD: 296.0000 mL | Freq: Once | ORAL | Status: DC

## 2022-04-15 ENCOUNTER — Ambulatory Visit (HOSPITAL_BASED_OUTPATIENT_CLINIC_OR_DEPARTMENT_OTHER): Payer: BC Managed Care – PPO | Admitting: Anesthesiology

## 2022-04-15 ENCOUNTER — Other Ambulatory Visit: Payer: Self-pay

## 2022-04-15 ENCOUNTER — Ambulatory Visit (HOSPITAL_BASED_OUTPATIENT_CLINIC_OR_DEPARTMENT_OTHER)
Admission: RE | Admit: 2022-04-15 | Discharge: 2022-04-15 | Disposition: A | Discharge status: Home / Self Care | Payer: BC Managed Care – PPO | Source: Ambulatory Visit | Attending: General Surgery | Admitting: General Surgery

## 2022-04-15 ENCOUNTER — Encounter (HOSPITAL_BASED_OUTPATIENT_CLINIC_OR_DEPARTMENT_OTHER): Payer: Self-pay | Admitting: General Surgery

## 2022-04-15 ENCOUNTER — Encounter (HOSPITAL_BASED_OUTPATIENT_CLINIC_OR_DEPARTMENT_OTHER)
Admission: RE | Disposition: A | Discharge status: Home / Self Care | Payer: Self-pay | Source: Ambulatory Visit | Attending: General Surgery

## 2022-04-15 ENCOUNTER — Ambulatory Visit
Admission: RE | Admit: 2022-04-15 | Discharge: 2022-04-15 | Disposition: A | Discharge status: Home / Self Care | Payer: BC Managed Care – PPO | Source: Ambulatory Visit | Attending: General Surgery | Admitting: General Surgery

## 2022-04-15 DIAGNOSIS — N61 Mastitis without abscess: Secondary | ICD-10-CM | POA: Diagnosis not present

## 2022-04-15 DIAGNOSIS — N6452 Nipple discharge: Secondary | ICD-10-CM | POA: Insufficient documentation

## 2022-04-15 DIAGNOSIS — N6031 Fibrosclerosis of right breast: Secondary | ICD-10-CM | POA: Diagnosis not present

## 2022-04-15 DIAGNOSIS — D241 Benign neoplasm of right breast: Secondary | ICD-10-CM | POA: Insufficient documentation

## 2022-04-15 DIAGNOSIS — N641 Fat necrosis of breast: Secondary | ICD-10-CM | POA: Diagnosis not present

## 2022-04-15 DIAGNOSIS — N6310 Unspecified lump in the right breast, unspecified quadrant: Secondary | ICD-10-CM

## 2022-04-15 DIAGNOSIS — N6041 Mammary duct ectasia of right breast: Secondary | ICD-10-CM | POA: Diagnosis not present

## 2022-04-15 DIAGNOSIS — R928 Other abnormal and inconclusive findings on diagnostic imaging of breast: Secondary | ICD-10-CM | POA: Diagnosis not present

## 2022-04-15 DIAGNOSIS — Z01818 Encounter for other preprocedural examination: Secondary | ICD-10-CM

## 2022-04-15 DIAGNOSIS — Z79899 Other long term (current) drug therapy: Secondary | ICD-10-CM

## 2022-04-15 HISTORY — PX: RADIOACTIVE SEED GUIDED EXCISIONAL BREAST BIOPSY: SHX6490

## 2022-04-15 LAB — POCT PREGNANCY, URINE: Preg Test, Ur: NEGATIVE

## 2022-04-15 SURGERY — RADIOACTIVE SEED GUIDED BREAST BIOPSY
Anesthesia: General | Site: Breast | Laterality: Right

## 2022-04-15 MED ORDER — PROMETHAZINE HCL 25 MG/ML IJ SOLN: 6.2500 mg | INTRAMUSCULAR | Status: DC | PRN

## 2022-04-15 MED ORDER — FENTANYL CITRATE (PF) 100 MCG/2ML IJ SOLN: 25.0000 ug | INTRAMUSCULAR | Status: DC | PRN

## 2022-04-15 MED ORDER — LIDOCAINE HCL (CARDIAC) PF 100 MG/5ML IV SOSY
PREFILLED_SYRINGE | INTRAVENOUS | Status: DC | PRN
  Administered 2022-04-15: 60 mg via INTRAVENOUS

## 2022-04-15 MED ORDER — KETOROLAC TROMETHAMINE 30 MG/ML IJ SOLN: 30.0000 mg | Freq: Once | INTRAMUSCULAR | Status: DC | PRN

## 2022-04-15 MED ORDER — PROPOFOL 10 MG/ML IV BOLUS
INTRAVENOUS | Status: AC
  Filled 2022-04-15: qty 20

## 2022-04-15 MED ORDER — DEXAMETHASONE SODIUM PHOSPHATE 10 MG/ML IJ SOLN
INTRAMUSCULAR | Status: DC | PRN
  Administered 2022-04-15: 10 mg via INTRAVENOUS

## 2022-04-15 MED ORDER — CHLORHEXIDINE GLUCONATE CLOTH 2 % EX PADS: 6.0000 | MEDICATED_PAD | Freq: Once | CUTANEOUS | Status: DC

## 2022-04-15 MED ORDER — ONDANSETRON HCL 4 MG/2ML IJ SOLN
INTRAMUSCULAR | Status: AC
  Filled 2022-04-15: qty 2

## 2022-04-15 MED ORDER — FENTANYL CITRATE (PF) 100 MCG/2ML IJ SOLN
INTRAMUSCULAR | Status: AC
  Filled 2022-04-15: qty 2

## 2022-04-15 MED ORDER — LACTATED RINGERS IV SOLN: INTRAVENOUS | Status: DC

## 2022-04-15 MED ORDER — AMISULPRIDE (ANTIEMETIC) 5 MG/2ML IV SOLN: 10.0000 mg | Freq: Once | INTRAVENOUS | Status: DC | PRN

## 2022-04-15 MED ORDER — ONDANSETRON HCL 4 MG/2ML IJ SOLN
INTRAMUSCULAR | Status: DC | PRN
  Administered 2022-04-15: 4 mg via INTRAVENOUS

## 2022-04-15 MED ORDER — PROPOFOL 10 MG/ML IV BOLUS
INTRAVENOUS | Status: DC | PRN
  Administered 2022-04-15: 200 mg via INTRAVENOUS

## 2022-04-15 MED ORDER — MIDAZOLAM HCL 5 MG/5ML IJ SOLN
INTRAMUSCULAR | Status: DC | PRN
  Administered 2022-04-15: 2 mg via INTRAVENOUS

## 2022-04-15 MED ORDER — ACETAMINOPHEN 500 MG PO TABS
1000.0000 mg | ORAL_TABLET | ORAL | Status: AC
  Administered 2022-04-15: 1000 mg via ORAL

## 2022-04-15 MED ORDER — CIPROFLOXACIN IN D5W 400 MG/200ML IV SOLN
400.0000 mg | INTRAVENOUS | Status: AC
  Administered 2022-04-15: 400 mg via INTRAVENOUS

## 2022-04-15 MED ORDER — CIPROFLOXACIN IN D5W 400 MG/200ML IV SOLN
INTRAVENOUS | Status: AC
  Filled 2022-04-15: qty 200

## 2022-04-15 MED ORDER — DEXAMETHASONE SODIUM PHOSPHATE 10 MG/ML IJ SOLN
INTRAMUSCULAR | Status: AC
  Filled 2022-04-15: qty 1

## 2022-04-15 MED ORDER — FENTANYL CITRATE (PF) 100 MCG/2ML IJ SOLN
INTRAMUSCULAR | Status: DC | PRN
  Administered 2022-04-15 (×2): 50 ug via INTRAVENOUS

## 2022-04-15 MED ORDER — OXYCODONE HCL 5 MG/5ML PO SOLN: 5.0000 mg | Freq: Once | ORAL | Status: DC | PRN

## 2022-04-15 MED ORDER — OXYCODONE HCL 5 MG PO TABS: 5.0000 mg | ORAL_TABLET | Freq: Once | ORAL | Status: DC | PRN

## 2022-04-15 MED ORDER — LIDOCAINE 2% (20 MG/ML) 5 ML SYRINGE
INTRAMUSCULAR | Status: AC
  Filled 2022-04-15: qty 5

## 2022-04-15 MED ORDER — BUPIVACAINE HCL (PF) 0.25 % IJ SOLN
INTRAMUSCULAR | Status: DC | PRN
  Administered 2022-04-15: 7 mL

## 2022-04-15 MED ORDER — MIDAZOLAM HCL 2 MG/2ML IJ SOLN
INTRAMUSCULAR | Status: AC
  Filled 2022-04-15: qty 2

## 2022-04-15 MED ORDER — ACETAMINOPHEN 500 MG PO TABS
ORAL_TABLET | ORAL | Status: AC
  Filled 2022-04-15: qty 2

## 2022-04-15 SURGICAL SUPPLY — 56 items
ADH SKN CLS APL DERMABOND .7 (GAUZE/BANDAGES/DRESSINGS) ×1
APL PRP STRL LF DISP 70% ISPRP (MISCELLANEOUS) ×1
APPLIER CLIP 9.375 MED OPEN (MISCELLANEOUS)
APR CLP MED 9.3 20 MLT OPN (MISCELLANEOUS)
BINDER BREAST LRG (GAUZE/BANDAGES/DRESSINGS) IMPLANT
BINDER BREAST MEDIUM (GAUZE/BANDAGES/DRESSINGS) IMPLANT
BINDER BREAST XLRG (GAUZE/BANDAGES/DRESSINGS) IMPLANT
BINDER BREAST XXLRG (GAUZE/BANDAGES/DRESSINGS) IMPLANT
BLADE SURG 15 STRL LF DISP TIS (BLADE) ×1 IMPLANT
BLADE SURG 15 STRL SS (BLADE) ×1
CANISTER SUC SOCK COL 7IN (MISCELLANEOUS) IMPLANT
CANISTER SUCT 1200ML W/VALVE (MISCELLANEOUS) IMPLANT
CHLORAPREP W/TINT 26 (MISCELLANEOUS) ×1 IMPLANT
CLIP APPLIE 9.375 MED OPEN (MISCELLANEOUS) IMPLANT
CLIP TI WIDE RED SMALL 6 (CLIP) IMPLANT
COVER BACK TABLE 60X90IN (DRAPES) ×1 IMPLANT
COVER MAYO STAND STRL (DRAPES) ×1 IMPLANT
COVER PROBE CYLINDRICAL 5X96 (MISCELLANEOUS) ×1 IMPLANT
DERMABOND ADVANCED .7 DNX12 (GAUZE/BANDAGES/DRESSINGS) ×1 IMPLANT
DRAPE LAPAROSCOPIC ABDOMINAL (DRAPES) ×1 IMPLANT
DRAPE UTILITY XL STRL (DRAPES) ×1 IMPLANT
DRSG TEGADERM 4X4.75 (GAUZE/BANDAGES/DRESSINGS) IMPLANT
ELECT COATED BLADE 2.86 ST (ELECTRODE) ×1 IMPLANT
ELECT REM PT RETURN 9FT ADLT (ELECTROSURGICAL) ×1
ELECTRODE REM PT RTRN 9FT ADLT (ELECTROSURGICAL) ×1 IMPLANT
GAUZE SPONGE 4X4 12PLY STRL LF (GAUZE/BANDAGES/DRESSINGS) IMPLANT
GLOVE BIO SURGEON STRL SZ7 (GLOVE) ×2 IMPLANT
GLOVE BIOGEL PI IND STRL 6.5 (GLOVE) IMPLANT
GLOVE BIOGEL PI IND STRL 7.0 (GLOVE) IMPLANT
GLOVE BIOGEL PI IND STRL 7.5 (GLOVE) ×1 IMPLANT
GOWN STRL REUS W/ TWL LRG LVL3 (GOWN DISPOSABLE) ×2 IMPLANT
GOWN STRL REUS W/TWL LRG LVL3 (GOWN DISPOSABLE) ×2
HEMOSTAT ARISTA ABSORB 3G PWDR (HEMOSTASIS) IMPLANT
KIT MARKER MARGIN INK (KITS) ×1 IMPLANT
NDL HYPO 25X1 1.5 SAFETY (NEEDLE) ×1 IMPLANT
NEEDLE HYPO 25X1 1.5 SAFETY (NEEDLE) ×1 IMPLANT
NS IRRIG 1000ML POUR BTL (IV SOLUTION) IMPLANT
PACK BASIN DAY SURGERY FS (CUSTOM PROCEDURE TRAY) ×1 IMPLANT
PENCIL SMOKE EVACUATOR (MISCELLANEOUS) ×1 IMPLANT
RETRACTOR ONETRAX LX 90X20 (MISCELLANEOUS) IMPLANT
SLEEVE SCD COMPRESS KNEE MED (STOCKING) ×1 IMPLANT
SPIKE FLUID TRANSFER (MISCELLANEOUS) IMPLANT
SPONGE T-LAP 4X18 ~~LOC~~+RFID (SPONGE) ×1 IMPLANT
STRIP CLOSURE SKIN 1/2X4 (GAUZE/BANDAGES/DRESSINGS) ×1 IMPLANT
SUT MNCRL AB 4-0 PS2 18 (SUTURE) IMPLANT
SUT MON AB 5-0 PS2 18 (SUTURE) IMPLANT
SUT SILK 2 0 SH (SUTURE) IMPLANT
SUT VIC AB 2-0 SH 27 (SUTURE) ×1
SUT VIC AB 2-0 SH 27XBRD (SUTURE) ×1 IMPLANT
SUT VIC AB 3-0 SH 27 (SUTURE) ×1
SUT VIC AB 3-0 SH 27X BRD (SUTURE) ×1 IMPLANT
SYR CONTROL 10ML LL (SYRINGE) ×1 IMPLANT
TOWEL GREEN STERILE FF (TOWEL DISPOSABLE) ×1 IMPLANT
TRAY FAXITRON CT DISP (TRAY / TRAY PROCEDURE) ×1 IMPLANT
TUBE CONNECTING 20X1/4 (TUBING) IMPLANT
YANKAUER SUCT BULB TIP NO VENT (SUCTIONS) IMPLANT

## 2022-04-15 NOTE — Op Note (Signed)
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 Serita Grammes Anesthesia: General Estimated blood loss: Minimal Complications: None Drains: None Specimens: Right breast tissue with 2 clips and radioactive seed present Sponge and needle count was correct completion Disposition to recovery stable condition  Indications:22 year old healthy female with some bloody spontaneous right nipple discharge for about 5 to 6 months. This started clear before that. It was bloody then after that. She does not feel a mass. . She underwent an initial ultrasound in January of this year. She has D density breast tissue. The ultrasound shows a solitary dilated duct in the 3:00 region of the right breast 2 cm from the nipple. There may be an intraductal mass present. Biopsy was done showing a benign breast with fibrocystic changes. 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  We discussed proceeding with a seed guided excisional biopsy.  Procedure: After informed consent was obtained she was taken to the operating room.  She was given antibiotics.  SCDs were placed.  She was placed under general anesthesia without complication.  She was prepped and draped in the standard sterile surgical fashion.  A surgical timeout was then performed.  I located the seed in the lower inner quadrant of the right breast.  I infiltrated Marcaine throughout this area.  I then made a periareolar incision.  I dissected to the seed and remove the seed and some of the surrounding tissue.  The seed and 2 clips were both present.  There was really no discharge that I could elicit after that point.   I did not dissect all the way up into the retroareolar space given her age.  I then obtained hemostasis.  I closed the breast tissue down with 2-0 Vicryl suture to obliterate the cavity.  I then closed this with 3-0 Vicryl and 5-0 Monocryl.  Glue and Steri-Strips were applied.  She tolerated this well was extubated and transferred to recovery stable.

## 2022-04-15 NOTE — H&P (Signed)
  22 year old healthy female who is here with her mom Dr. Shon Hale. She has a history of some bloody spontaneous right nipple discharge for about 5 to 6 months. This started clear before that. It was bloody then after that. She does not feel a mass. She has a family history that is remote and some postmenopausal relatives. She underwent an initial ultrasound in January of this year. She has D density breast tissue. The ultrasound shows a solitary dilated duct in the 3:00 region of the right breast 2 cm from the nipple. There may be an intraductal mass present. Biopsy was done showing a benign breast with fibrocystic changes. She was then recommended for MRI and MR biopsy.The MR showed her to have an indeterminate linear non-mass enhancement spanning 1.6 cm that is in the medial retroareolar right breast.   Review of Systems: A complete review of systems was obtained from the patient. I have reviewed this information and discussed as appropriate with the patient. See HPI as well for other ROS.  Review of Systems All other systems reviewed and are negative.   Medical History: Past Medical History: Diagnosis Date Asthma, unspecified asthma severity, unspecified whether complicated, unspecified whether persistent  There is no problem list on file for this patient.  Past Surgical History: Procedure Laterality Date wisdom teeth   Allergies Allergen Reactions Penicillins Hives and Rash Amoxicillin unknown Amoxicillin  unknown Amoxicillin  Current Outpatient Medications on File Prior to Visit Medication Sig Dispense Refill CLINDACIN P 1 % swab APPLY ONCE EVERY MORNING TO FACE AND CHEST. DAW-1 HAILEY 24 FE 1 mg-20 mcg (24)/75 mg (4) tablet TAKE 1 TABLET BY MOUTH EVERY DAY FOR 84 DAYS spironolactone (ALDACTONE) 100 MG tablet Take 1 tablet by mouth once daily  History reviewed. No pertinent family history.  Social History  Tobacco Use Smoking Status Never Smokeless Tobacco  Never Marital status: Single Tobacco Use Smoking status: Never Smokeless tobacco: Never Substance and Sexual Activity Alcohol use: Yes Drug use: Not Currently  Objective:  Body mass index is 27.17 kg/m.  Physical Exam Vitals reviewed. Constitutional: Appearance: Normal appearance. Chest: Breasts: Right: No inverted nipple, mass or nipple discharge. Comments: I cannot express any dc today Lymphadenopathy: Upper Body: Right upper body: No axillary adenopathy. Neurological: Mental Status: She is alert.    Assessment and Plan:  Diagnoses and all orders for this visit:  Bloody discharge from right nipple  Right breast radioactive seed guided excisional biopsy with directed duct excision.  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   We discussed a seed guided excisional biopsy as well as an excision of the discharging ductal system using a lacrimal duct probe. Risks and recovery were discussed. Will plan on proceeding in April when she has a break from [place] school.

## 2022-04-15 NOTE — Anesthesia Preprocedure Evaluation (Addendum)
Anesthesia Evaluation  Patient identified by MRN, date of birth, ID band Patient awake    Reviewed: Allergy & Precautions, NPO status , Patient's Chart, lab work & pertinent test results  Airway Mallampati: II  TM Distance: >3 FB Neck ROM: Full    Dental no notable dental hx.    Pulmonary asthma    Pulmonary exam normal        Cardiovascular negative cardio ROS Normal cardiovascular exam     Neuro/Psych  Headaches PSYCHIATRIC DISORDERS Anxiety        GI/Hepatic negative GI ROS, Neg liver ROS,,,  Endo/Other  negative endocrine ROS    Renal/GU negative Renal ROS     Musculoskeletal negative musculoskeletal ROS (+)    Abdominal   Peds  Hematology negative hematology ROS (+)   Anesthesia Other Findings RIGHT BLOODY NIPPLE DISCHARGE  Reproductive/Obstetrics Hcg negative                             Anesthesia Physical Anesthesia Plan  ASA: 2  Anesthesia Plan: General   Post-op Pain Management:    Induction: Intravenous  PONV Risk Score and Plan: 3 and Ondansetron, Dexamethasone, Midazolam and Treatment may vary due to age or medical condition  Airway Management Planned: LMA  Additional Equipment:   Intra-op Plan:   Post-operative Plan: Extubation in OR  Informed Consent: I have reviewed the patients History and Physical, chart, labs and discussed the procedure including the risks, benefits and alternatives for the proposed anesthesia with the patient or authorized representative who has indicated his/her understanding and acceptance.     Dental advisory given  Plan Discussed with: CRNA  Anesthesia Plan Comments:        Anesthesia Quick Evaluation

## 2022-04-15 NOTE — Discharge Instructions (Addendum)
No Tylenol until 1:45 today if needed  [employer] Phone Number [phone]  POST OP INSTRUCTIONS Take 400 mg of ibuprofen every 8 hours or 650 mg tylenol every 6 hours for next 72 hours then as needed. Use ice several times daily also.  A prescription for pain medication may be given to you upon discharge.  Take your pain medication as prescribed, if needed.  If narcotic pain medicine is not needed, then you may take acetaminophen (Tylenol), naprosyn (Alleve) or ibuprofen (Advil) as needed. Take your usually prescribed medications unless otherwise directed If you need a refill on your pain medication, please contact your pharmacy.  They will contact our office to request authorization.  Prescriptions will not be filled after 5pm or on week-ends. You should eat very light the first 24 hours after surgery, such as soup, crackers, pudding, etc.  Resume your normal diet the day after surgery. Most patients will experience some swelling and bruising in the breast.  Ice packs and a good support bra will help.  Wear the breast binder provided or a sports bra for 72 hours day and night.  After that wear a sports bra during the day until you return to the office. Swelling and bruising can take several days to resolve.  It is common to experience some constipation if taking pain medication after surgery.  Increasing fluid intake and taking a stool softener will usually help or prevent this problem from occurring.  A mild laxative (Milk of Magnesia or Miralax) should be taken according to package directions if there are no bowel movements after 48 hours. I used skin glue on the incision, you may shower in 24 hours.  The glue will flake off over the next 2-3 weeks.  Any sutures or staples will be removed at the office during your follow-up visit. ACTIVITIES:  You may resume regular daily activities (gradually increasing) beginning the next day.  Wearing a good support bra or sports bra  minimizes pain and swelling.  You may have sexual intercourse when it is comfortable. You may drive when you no longer are taking prescription pain medication, you can comfortably wear a seatbelt, and you can safely maneuver your car and apply brakes. RETURN TO WORK:  ______________________________________________________________________________________ Dennis Bast should see your doctor in the office for a follow-up appointment approximately two weeks after your surgery.  Your doctor's nurse will typically make your follow-up appointment when she calls you with your pathology report.  Expect your pathology report 3-4 business days after your surgery.  You may call to check if you do not hear from [place] after three days. OTHER INSTRUCTIONS: _______________________________________________________________________________________________ _____________________________________________________________________________________________________________________________________ _____________________________________________________________________________________________________________________________________ _____________________________________________________________________________________________________________________________________  WHEN TO CALL DR WAKEFIELD: Fever over 101.0 Nausea and/or vomiting. Extreme swelling or bruising. Continued bleeding from incision. Increased pain, redness, or drainage from the incision.  The clinic staff is available to answer your questions during regular business hours.  Please don't hesitate to call and ask to speak to one of the nurses for clinical concerns.  If you have a medical emergency, go to the nearest emergency room or call 911.  A surgeon from [facility] is always on call at the hospital.  For further questions, please visit [email or web address]

## 2022-04-15 NOTE — Anesthesia Procedure Notes (Signed)
Procedure Name: LMA Insertion Date/Time: 04/15/2022 8:36 AM  Performed by: Lavonia Dana, CRNAPre-anesthesia Checklist: Patient identified, Emergency Drugs available, Suction available and Patient being monitored Patient Re-evaluated:Patient Re-evaluated prior to induction Oxygen Delivery Method: Circle system utilized Preoxygenation: Pre-oxygenation with 100% oxygen Induction Type: IV induction Ventilation: Mask ventilation without difficulty LMA: LMA inserted LMA Size: 4.0 Number of attempts: 1 Airway Equipment and Method: Bite block Placement Confirmation: positive ETCO2 Tube secured with: Tape Dental Injury: Teeth and Oropharynx as per pre-operative assessment

## 2022-04-15 NOTE — Anesthesia Postprocedure Evaluation (Signed)
Anesthesia Post Note  Patient: Cynthia Washington  Procedure(s) Performed: RADIOACTIVE SEED GUIDED EXCISIONAL RIGHT BREAST BIOPSY (Right: Breast)     Patient location during evaluation: PACU Anesthesia Type: General Level of consciousness: awake Pain management: pain level controlled Vital Signs Assessment: post-procedure vital signs reviewed and stable Respiratory status: spontaneous breathing, nonlabored ventilation and respiratory function stable Cardiovascular status: blood pressure returned to baseline and stable Postop Assessment: no apparent nausea or vomiting Anesthetic complications: no   No notable events documented.  Last Vitals:  Vitals:   04/15/22 1000 04/15/22 1011  BP: 115/77 (!) 126/92  Pulse: 89 100  Resp: 16 16  Temp:  (!) 36.4 C  SpO2: 96% 98%    Last Pain:  Vitals:   04/15/22 1011  TempSrc:   PainSc: 0-No pain                 Montrice Montuori P Grete Bosko

## 2022-04-15 NOTE — Transfer of Care (Signed)
Immediate Anesthesia Transfer of Care Note  Patient: Cynthia Washington  Procedure(s) Performed: RADIOACTIVE SEED GUIDED EXCISIONAL RIGHT BREAST BIOPSY (Right: Breast)  Patient Location: PACU  Anesthesia Type:General  Level of Consciousness: awake, alert , and oriented  Airway & Oxygen Therapy: Patient Spontanous Breathing and Patient connected to face mask oxygen  Post-op Assessment: Report given to RN and Post -op Vital signs reviewed and stable  Post vital signs: Reviewed and stable  Last Vitals:   BP: 129/89 (101) Vitals Value Taken Time  BP 136/110 04/15/22 0922  Temp    Pulse 113 04/15/22 0923  Resp 16 04/15/22 0923  SpO2 100 % 04/15/22 0923  Vitals shown include unvalidated device data.  Last Pain:  Vitals:   04/15/22 0736  TempSrc: Oral  PainSc: 0-No pain         Complications: No notable events documented.

## 2022-04-16 ENCOUNTER — Encounter (HOSPITAL_BASED_OUTPATIENT_CLINIC_OR_DEPARTMENT_OTHER): Payer: Self-pay | Admitting: General Surgery

## 2022-04-17 LAB — SURGICAL PATHOLOGY

## 2022-04-21 ENCOUNTER — Encounter: Payer: Self-pay | Admitting: Nurse Practitioner

## 2022-04-21 DIAGNOSIS — D241 Benign neoplasm of right breast: Secondary | ICD-10-CM | POA: Insufficient documentation

## 2022-07-11 DIAGNOSIS — F411 Generalized anxiety disorder: Secondary | ICD-10-CM | POA: Diagnosis not present

## 2022-07-11 DIAGNOSIS — R635 Abnormal weight gain: Secondary | ICD-10-CM | POA: Diagnosis not present

## 2022-12-30 DIAGNOSIS — Z01419 Encounter for gynecological examination (general) (routine) without abnormal findings: Secondary | ICD-10-CM | POA: Diagnosis not present

## 2022-12-30 DIAGNOSIS — Z6827 Body mass index (BMI) 27.0-27.9, adult: Secondary | ICD-10-CM | POA: Diagnosis not present

## 2022-12-30 DIAGNOSIS — Z113 Encounter for screening for infections with a predominantly sexual mode of transmission: Secondary | ICD-10-CM | POA: Diagnosis not present
